# Patient Record
Sex: Female | Born: 2000
Health system: Southern US, Community
[De-identification: ages and names within clinical notes are randomized; demographics above are authoritative.]

## PROBLEM LIST (undated history)

## (undated) DIAGNOSIS — D649 Anemia, unspecified: Secondary | ICD-10-CM

## (undated) DIAGNOSIS — E282 Polycystic ovarian syndrome: Secondary | ICD-10-CM

## (undated) DIAGNOSIS — J45909 Unspecified asthma, uncomplicated: Secondary | ICD-10-CM

## (undated) HISTORY — DX: Anemia, unspecified: D64.9

## (undated) HISTORY — DX: Unspecified asthma, uncomplicated: J45.909

---

## 2020-05-30 ENCOUNTER — Emergency Department (HOSPITAL_COMMUNITY)
Admission: EM | Admit: 2020-05-30 | Discharge: 2020-05-31 | Disposition: A | Discharge status: Home / Self Care | Attending: Emergency Medicine | Admitting: Emergency Medicine

## 2020-05-30 DIAGNOSIS — R55 Syncope and collapse: Secondary | ICD-10-CM | POA: Insufficient documentation

## 2020-05-30 DIAGNOSIS — R569 Unspecified convulsions: Secondary | ICD-10-CM | POA: Diagnosis present

## 2020-05-30 DIAGNOSIS — G40909 Epilepsy, unspecified, not intractable, without status epilepticus: Secondary | ICD-10-CM | POA: Diagnosis not present

## 2020-05-31 ENCOUNTER — Encounter (HOSPITAL_COMMUNITY): Payer: Self-pay

## 2020-05-31 ENCOUNTER — Emergency Department (HOSPITAL_COMMUNITY)

## 2020-05-31 ENCOUNTER — Other Ambulatory Visit: Payer: Self-pay

## 2020-05-31 LAB — CBC
HCT: 38.9 % (ref 36.0–46.0)
Hemoglobin: 12 g/dL (ref 12.0–15.0)
MCH: 27.5 pg (ref 26.0–34.0)
MCHC: 30.8 g/dL (ref 30.0–36.0)
MCV: 89 fL (ref 80.0–100.0)
Platelets: 421 10*3/uL — ABNORMAL HIGH (ref 150–400)
RBC: 4.37 MIL/uL (ref 3.87–5.11)
RDW: 13.9 % (ref 11.5–15.5)
WBC: 11 10*3/uL — ABNORMAL HIGH (ref 4.0–10.5)
nRBC: 0 % (ref 0.0–0.2)

## 2020-05-31 LAB — BASIC METABOLIC PANEL
Anion gap: 8 (ref 5–15)
BUN: 7 mg/dL (ref 6–20)
CO2: 25 mmol/L (ref 22–32)
Calcium: 9.2 mg/dL (ref 8.9–10.3)
Chloride: 103 mmol/L (ref 98–111)
Creatinine, Ser: 0.53 mg/dL (ref 0.44–1.00)
GFR, Estimated: >60 mL/min (ref >60)
Glucose, Bld: 104 mg/dL — ABNORMAL HIGH (ref 70–99)
Potassium: 3.7 mmol/L (ref 3.5–5.1)
Sodium: 136 mmol/L (ref 135–145)

## 2020-05-31 LAB — I-STAT BETA HCG BLOOD, ED (MC, WL, AP ONLY): I-stat hCG, quantitative: <5 m[IU]/mL (ref <5)

## 2020-05-31 NOTE — Discharge Instructions (Addendum)
You were evaluated in the Emergency Department and after careful evaluation, we did not find any emergent condition requiring admission or further testing in the hospital.  Your exam/testing today was overall reassuring.  Labs and CT of your brain were normal.  As discussed, we recommend following up with neurology given the report or possibility of seizure activity.  Recommend that you do not drive a car until you are cleared to do so by neurology.  Please return to the Emergency Department if you experience any worsening of your condition.  Thank you for allowing [place] to be a part of your care.

## 2020-05-31 NOTE — ED Provider Notes (Signed)
[facility]-EMERGENCY DEPT [facility] Emergency Department Provider Note MRN:  [id number]  Arrival date & time: 05/31/20     Chief Complaint   Seizure like activity History of Present Illness   Ashlee Kidd is a 20 y.o. year-old female with no pertinent past medical history presenting to the ED with chief complaint of seizure like activity.  Patient was donating plasma today, she had to leave and so stood up to have the nurse help her remove the IV and then she felt generally unwell, lightheaded and then lost consciousness.  Reportedly she was helped to the ground and exhibited seizure activity.  She denies any urinary incontinence, no tongue biting, she does not recall events while she was exhibiting the seizure like activity.  She woke up and felt generally back to her normal self within a few minutes.  Last time she donated plasma she passed out.  Review of Systems  A complete 10 system review of systems was obtained and all systems are negative except as noted in the HPI and PMH.   Patient's Health History   History reviewed. No pertinent past medical history.    History reviewed. No pertinent family history.  Social History   Socioeconomic History  . Marital status: Single    Spouse name: Not on file  . Number of children: Not on file  . Years of education: Not on file  . Highest education level: Not on file  Occupational History  . Not on file  Tobacco Use  . Smoking status: Not on file  . Smokeless tobacco: Not on file  Substance and Sexual Activity  . Alcohol use: Not on file  . Drug use: Not on file  . Sexual activity: Not on file  Other Topics Concern  . Not on file  Social History Narrative  . Not on file   Social Determinants of Health   Financial Resource Strain: Not on file  Food Insecurity: Not on file  Transportation Needs: Not on file  Physical Activity: Not on file  Stress: Not on file  Social Connections: Not on file  Intimate Partner Violence:  Not on file     Physical Exam   Vitals:   05/30/20 2358  BP: 128/80  Pulse: 94  Resp: 16  Temp: 98.1 F (36.7 C)  SpO2: 100%    CONSTITUTIONAL: Well-appearing, NAD NEURO:  Alert and oriented x 3, normal and symmetric strength and sensation, normal coordination, normal speech EYES:  eyes equal and reactive ENT/NECK:  no LAD, no JVD CARDIO: Regular rate, well-perfused, normal S1 and S2 PULM:  CTAB no wheezing or rhonchi GI/GU:  normal bowel sounds, non-distended, non-tender MSK/SPINE:  No gross deformities, no edema SKIN:  no rash, atraumatic PSYCH:  Appropriate speech and behavior  *Additional and/or pertinent findings included in MDM below  Diagnostic and Interventional Summary    EKG Interpretation  Date/Time:  Thursday May 31 2020 01:29:26 EDT Ventricular Rate:  78 PR Interval:  160 QRS Duration: 84 QT Interval:  376 QTC Calculation: 429 R Axis:   61 Text Interpretation: Sinus rhythm 12 Lead; Mason-Likar No previous ECGs available Confirmed by Kennis Carina [phone]) on 05/31/2020 2:33:49 AM      Labs Reviewed  CBC - Abnormal; Notable for the following components:      Result Value   WBC 11.0 (*)    Platelets 421 (*)    All other components within normal limits  BASIC METABOLIC PANEL - Abnormal; Notable for the following components:   Glucose,  Bld 104 (*)    All other components within normal limits  I-STAT BETA HCG BLOOD, ED ([facility], [facility], [facility] ONLY)    CT HEAD WO CONTRAST  Final Result      Medications - No data to display   Procedures  /  Critical Care Procedures  ED Course and Medical Decision Making  I have reviewed the triage vital signs, the nursing notes, and pertinent available records from the EMR.  Listed above are laboratory and imaging tests that I personally ordered, reviewed, and interpreted and then considered in my medical decision making (see below for details).  Overall I am favoring a vasovagal episode rather than a true epileptic seizure.   Can exhibit some twitching during a syncopal event, however the episode was witnessed by a nurse who felt it was consistent with a seizure.     Work-up is reassuring, appropriate for discharge.  Elmer Sow. Pilar Plate, MD [facility] Emergency Medicine [facility] [email or web address]  Final Clinical Impressions(s) / ED Diagnoses     ICD-10-CM   1. Syncope, convulsive  R55     ED Discharge Orders         Ordered    Ambulatory referral to Neurology       Comments: An appointment is requested in approximately: 2 weeks   05/31/20 0235           Discharge Instructions Discussed with and Provided to Patient:     Discharge Instructions     You were evaluated in the Emergency Department and after careful evaluation, we did not find any emergent condition requiring admission or further testing in the hospital.  Your exam/testing today was overall reassuring.  Labs and CT of your brain were normal.  As discussed, we recommend following up with neurology given the report or possibility of seizure activity.  Recommend that you do not drive a car until you are cleared to do so by neurology.  Please return to the Emergency Department if you experience any worsening of your condition.  Thank you for allowing [place] to be a part of your care.        Sabas Sous, MD 05/31/20 [phone]

## 2020-05-31 NOTE — ED Triage Notes (Signed)
Arrived POV. Donated plasma today at 1330. After donation pt had a witnessed seizure at the facility, unknown how long the seizure lasted. No hx of seizures. Pt wants to be examined to determine if she will have another seizure. Also wants her right arm looked at where needle was for plasma donation due to pain

## 2022-03-10 IMAGING — CT CT HEAD W/O CM
3 series · 15 of 44 positions shown, 18 images · non-contrast
Comparison: None.

CLINICAL DATA: Pt donated plasma 05/30/20 @ 6440 hrs, then had a
witness seizure of unknown duration, no hx of seizures, no prev ct
of head

EXAM:
CT HEAD WITHOUT CONTRAST
TECHNIQUE: Contiguous axial images were obtained from the base of the skull
through the vertex without intravenous contrast.

[Series 2: head wo · axial · 0.47mm/px · z∈[-142,-32]mm · 9 of 27 slices shown, 12 images]
[im 3/27  brain]
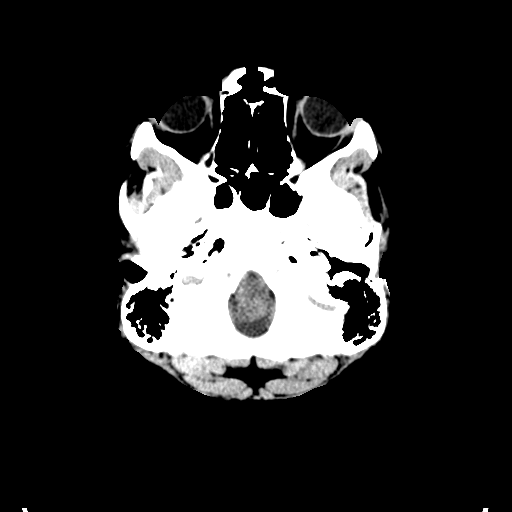
[im 3/27  bone]
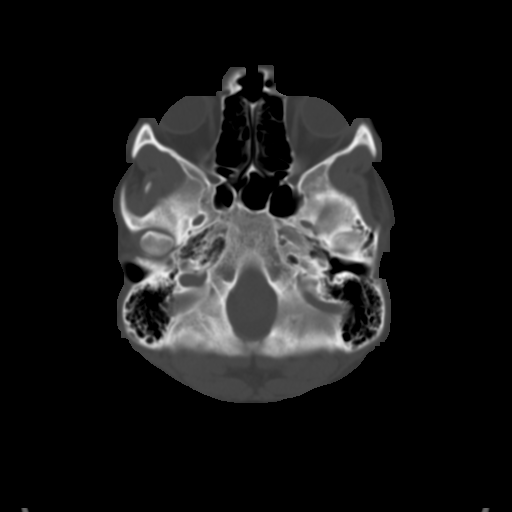
[im 6/27  brain]
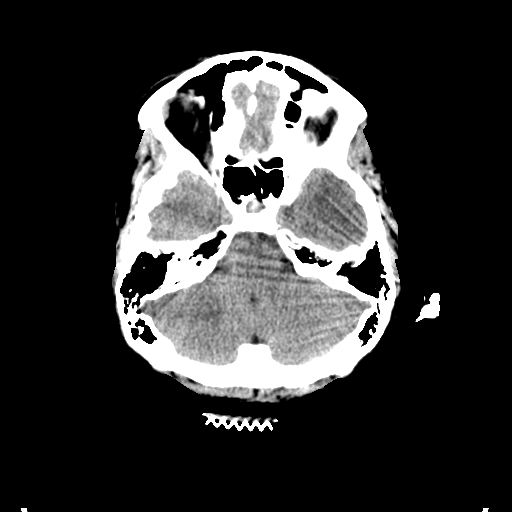
[im 8/27  brain]
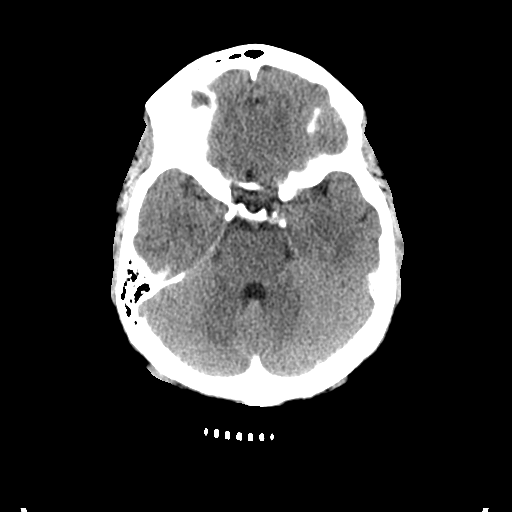
[im 11/27  brain]
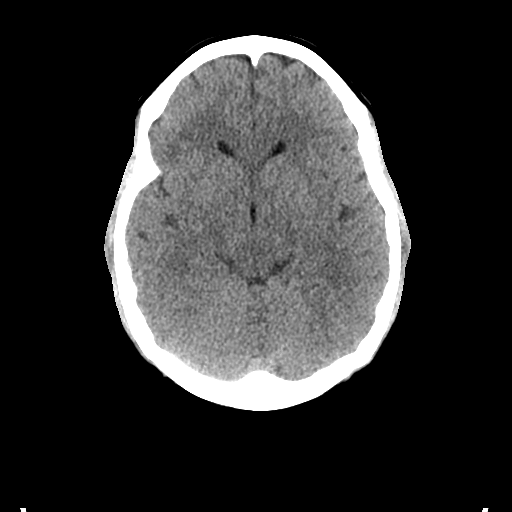
[im 14/27  brain]
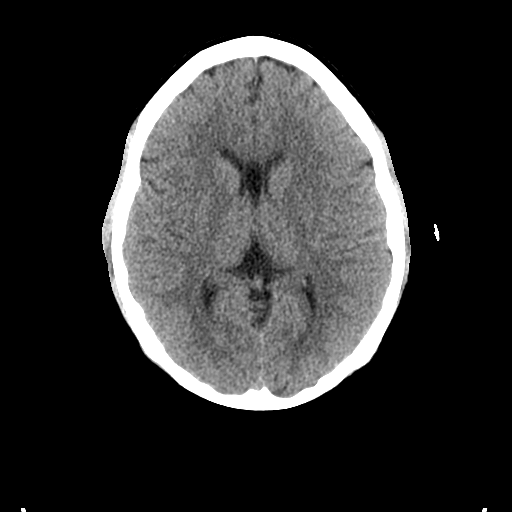
[im 14/27  bone]
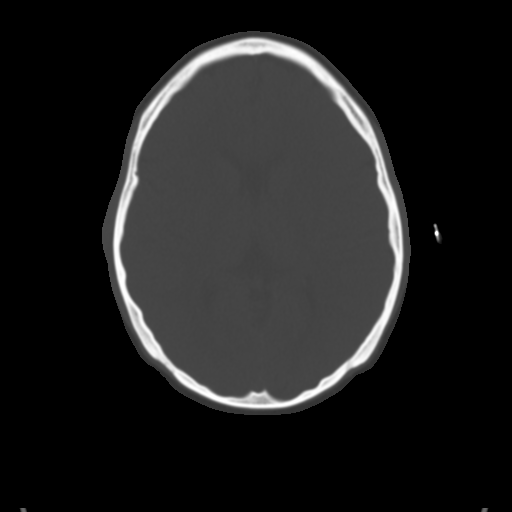
[im 17/27  brain]
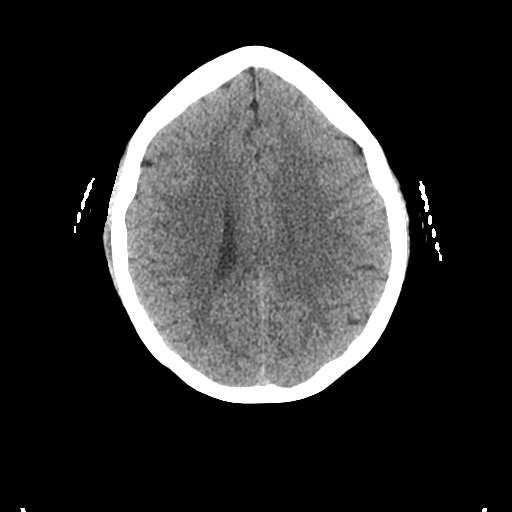
[im 20/27  brain]
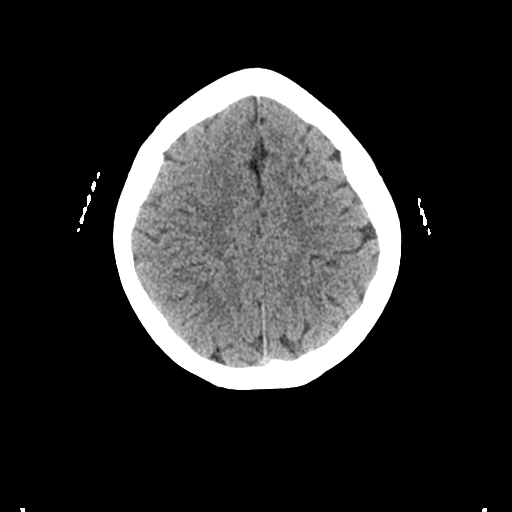
[im 22/27  brain]
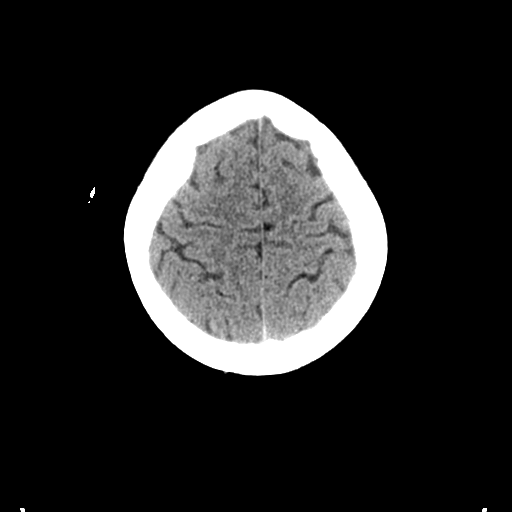
[im 25/27  brain]
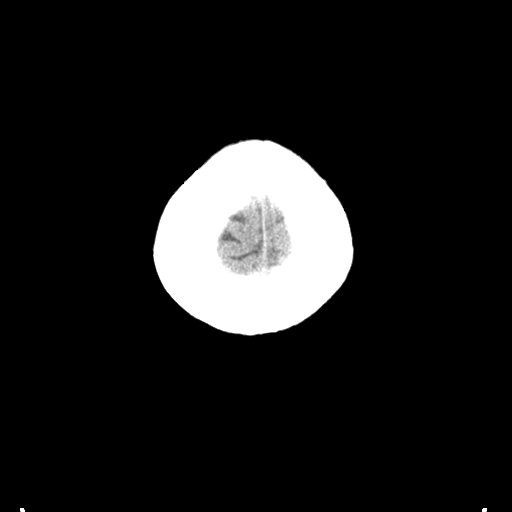
[im 25/27  bone]
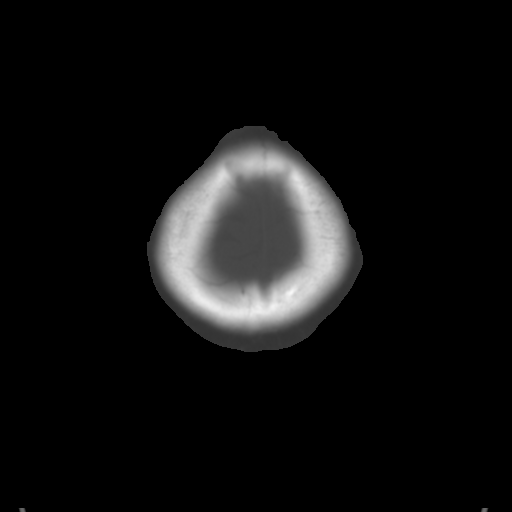

[Series 4: coronal soft tissue · coronal · 0.26mm/px · 3 of 64 slices shown]
[im 22/64  brain]
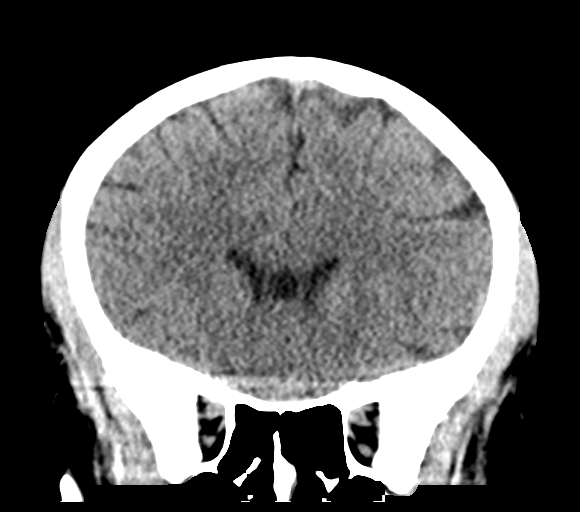
[im 29/64  brain]
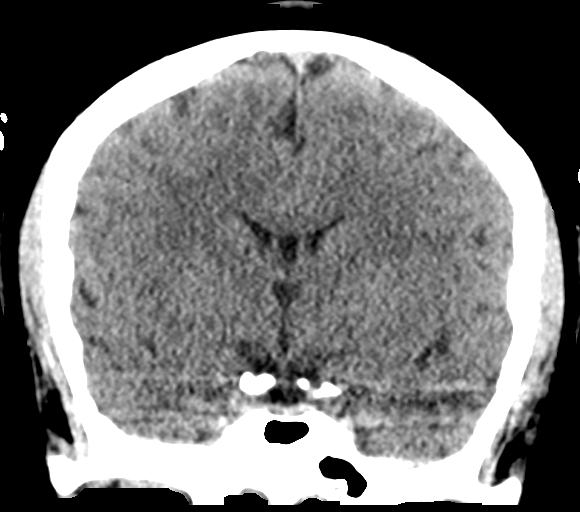
[im 36/64  brain]
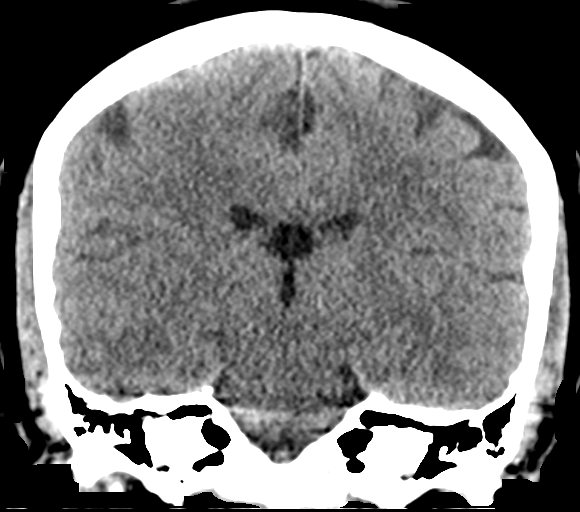

[Series 5: sagittal soft tissue · sagittal · 0.26mm/px · 3 of 52 slices shown]
[im 18/52  brain]
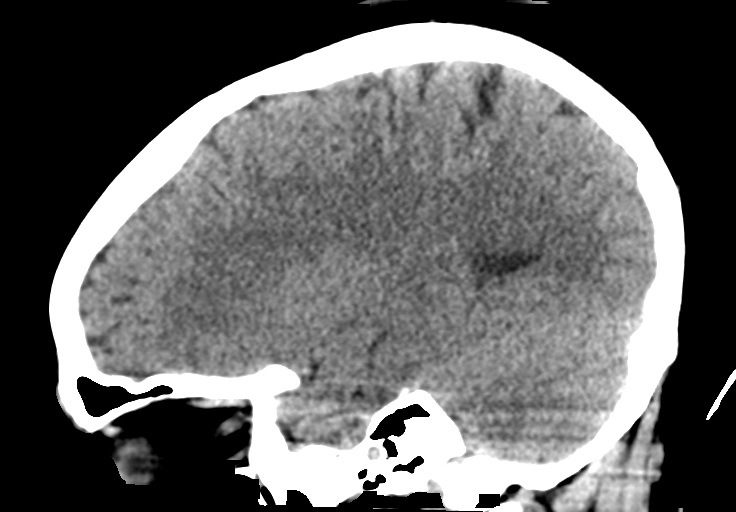
[im 26/52  brain]
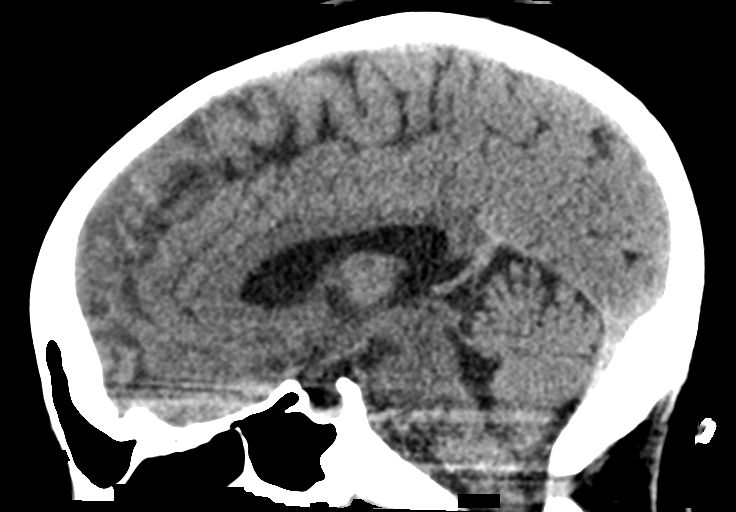
[im 35/52  brain]
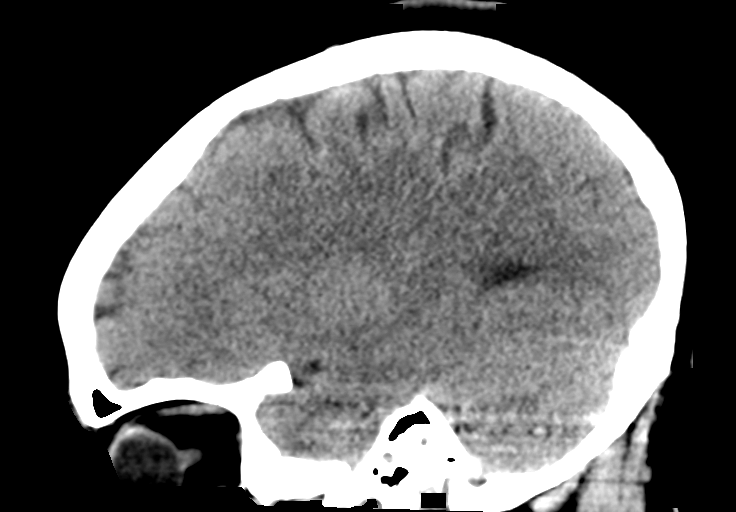

[15 of 44 positions shown; findings below may reference images not displayed]

FINDINGS: Brain:

No evidence of large-territorial acute infarction. No parenchymal
hemorrhage. No mass lesion. No extra-axial collection.

No mass effect or midline shift. No hydrocephalus. Basilar cisterns
are patent. Cavum septum pellucidum variant.

Vascular: No hyperdense vessel.

Skull: No acute fracture or focal lesion.

Sinuses/Orbits: Paranasal sinuses and mastoid air cells are clear.
The orbits are unremarkable.

Other: None.
IMPRESSION: No acute intracranial abnormality.

## 2022-10-27 ENCOUNTER — Encounter (HOSPITAL_COMMUNITY): Payer: Self-pay

## 2022-10-27 ENCOUNTER — Encounter: Payer: Self-pay | Admitting: Family Medicine

## 2022-10-27 ENCOUNTER — Ambulatory Visit (INDEPENDENT_AMBULATORY_CARE_PROVIDER_SITE_OTHER): Admitting: Family Medicine

## 2022-10-27 VITALS — BP 112/73 | HR 79 | Temp 98.5°F | Resp 18 | Ht 65.0 in | Wt 179.0 lb

## 2022-10-27 DIAGNOSIS — Z8379 Family history of other diseases of the digestive system: Secondary | ICD-10-CM

## 2022-10-27 DIAGNOSIS — Z7689 Persons encountering health services in other specified circumstances: Secondary | ICD-10-CM

## 2022-10-27 DIAGNOSIS — M7918 Myalgia, other site: Secondary | ICD-10-CM

## 2022-10-27 DIAGNOSIS — K921 Melena: Secondary | ICD-10-CM

## 2022-10-27 MED ORDER — BACLOFEN 5 MG PO TABS: 5.0000 mg | ORAL_TABLET | Freq: Three times a day (TID) | ORAL | 1 refills | Status: AC | PRN
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
## 2022-10-29 ENCOUNTER — Ambulatory Visit: Admitting: Family Medicine

## 2022-11-15 ENCOUNTER — Encounter (HOSPITAL_BASED_OUTPATIENT_CLINIC_OR_DEPARTMENT_OTHER): Payer: Self-pay

## 2022-11-15 ENCOUNTER — Ambulatory Visit (HOSPITAL_BASED_OUTPATIENT_CLINIC_OR_DEPARTMENT_OTHER): Attending: Family Medicine | Admitting: Physical Therapy

## 2022-11-15 ENCOUNTER — Encounter (HOSPITAL_BASED_OUTPATIENT_CLINIC_OR_DEPARTMENT_OTHER): Payer: Self-pay | Admitting: Physical Therapy
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
## 2022-11-24 ENCOUNTER — Ambulatory Visit: Admitting: Family Medicine

## 2023-03-09 ENCOUNTER — Ambulatory Visit: Payer: Self-pay | Admitting: Family Medicine

## 2023-03-09 NOTE — Telephone Encounter (Signed)
  Chief Complaint: migraine Symptoms: headache, muscle spasms, eye pain, stiff neck and back pain occasionally.  Frequency: 3 weeks Pertinent Negatives: Patient denies CP, SOB Disposition: [] ED /[] Urgent Care (no appt availability in office) / [x] Appointment(In office/virtual)/ []  [facility]/ [] Home Care/ [] Refused Recommended Disposition /[] [facility]/ []  Follow-up with PCP Additional Notes: Patient calls reporting migraine headache intermittently x3 weeks after receiving a series of vaccines for work. States she has been taking ibuprofen otc with mild relief. Per protocol, patient to be evaluated within 24 hours. First available appointment with PCP 03/10/23 at 1110, patient scheduled. Care advice reviewed, patient verbalized understanding and denies further questions at this time. Alerting PCP for review.    Copied from CRM [id number]. Topic: Clinical - Red Word Triage >> Mar 09, 2023  3:45 PM Everette Rank wrote: Red Word that prompted transfer to Nurse Triage: Patient migraine headaches/Have had sezures in past /Neck/Back pain/Black out spells for 3 weeks Reason for Disposition  [1] MODERATE headache (e.g., interferes with normal activities) AND [2] present > 24 hours AND [3] unexplained  (Exceptions: analgesics not tried, typical migraine, or headache part of viral illness)  Answer Assessment - Initial Assessment Questions 1. LOCATION: "Where does it hurt?"      All over headache, travels at times, mostly on the R side 2. ONSET: "When did the headache start?" (Minutes, hours or days)      3 weeks ago after receiving several vaccines 3. PATTERN: "Does the pain come and go, or has it been constant since it started?"     Comes and goes, but lasts 15 min to several hours 4. SEVERITY: "How bad is the pain?" and "What does it keep you from doing?"  (e.g., Scale 1-10; mild, moderate, or severe)   - MILD (1-3): doesn't interfere with normal activities    - MODERATE (4-7):  interferes with normal activities or awakens from sleep    - SEVERE (8-10): excruciating pain, unable to do any normal activities        8/10 when occurring, reports mild at this time 5. RECURRENT SYMPTOM: "Have you ever had headaches before?" If Yes, ask: "When was the last time?" and "What happened that time?"      Hx of migraines, last episode several months ago 6. CAUSE: "What do you think is causing the headache?"     Maybe vaccine reaction 7. MIGRAINE: "Have you been diagnosed with migraine headaches?" If Yes, ask: "Is this headache similar?"      Yes 8. HEAD INJURY: "Has there been any recent injury to the head?"      Denies 9. OTHER SYMPTOMS: "Do you have any other symptoms?" (fever, stiff neck, eye pain, sore throat, cold symptoms)     Muscle spasms in R shoulder, eye pain, stiff neck, back pain 10. PREGNANCY: "Is there any chance you are pregnant?" "When was your last menstrual period?"       Denies, LMP: 02/19/23  Protocols used: [facility]

## 2023-03-10 ENCOUNTER — Ambulatory Visit: Admitting: Family Medicine

## 2023-03-11 ENCOUNTER — Ambulatory Visit: Admitting: Family Medicine

## 2023-03-12 ENCOUNTER — Ambulatory Visit (INDEPENDENT_AMBULATORY_CARE_PROVIDER_SITE_OTHER): Admitting: Family Medicine

## 2023-03-12 ENCOUNTER — Encounter: Payer: Self-pay | Admitting: Family Medicine

## 2023-03-12 VITALS — BP 98/58 | HR 77 | Temp 97.9°F | Resp 18 | Ht 65.0 in | Wt 172.3 lb

## 2023-03-12 DIAGNOSIS — K529 Noninfective gastroenteritis and colitis, unspecified: Secondary | ICD-10-CM | POA: Diagnosis not present

## 2023-03-12 DIAGNOSIS — G43909 Migraine, unspecified, not intractable, without status migrainosus: Secondary | ICD-10-CM | POA: Diagnosis not present

## 2023-03-12 MED ORDER — ONDANSETRON 4 MG PO TBDP: 4.0000 mg | ORAL_TABLET | Freq: Three times a day (TID) | ORAL | 0 refills | Status: AC | PRN

## 2023-03-12 NOTE — Progress Notes (Signed)
 Acute Office Visit  Subjective:     Patient ID: Ashlee Kidd, female    DOB: 2000/12/30, 23 y.o.   MRN: [id number]  Chief Complaint  Patient presents with   Migraine    Patient states that she has been dealing with migraines for the past 3 weeks, she states that she believes its coming from an adverse reaction to vaccines that she was given for work. She states that 2 days ago she started vomiting , had stomach pain, neck pain, and muscle spasms.     HPI Patient is in today for acute visit.  Pt reports she started a new job and was required to get vaccines. She reports she is unable to tell me what vaccines she received. She reports she received 4 different vaccines that same week. She says the end of her 4th vaccines, she reports she had a rash on both of her hands and arms. The rash lasted for 2 days. She then had hives on her arms along with burning sensation. She went to [employer] through her job to get these vaccines. She tried contacting them but unable to reach them. She then went to [facility] who told her as long as she wasn't having an anaphylactic reaction, she would be ok. She does report hx of migraines in the past but none recently. She then a few days later, she experienced migraines that started on Feb 11th. She says she's had one almost every day. She may take otc medicine that may lessen the migraines. She reports she has had muscle spasms, feeling that she will black out, sharp pains in her chest and abdomen. She also reported vomiting episode on Tuesday evening.    Review of Systems  Gastrointestinal:  Positive for abdominal pain and vomiting.  Neurological:  Positive for headaches.  All other systems reviewed and are negative.      Objective:    BP (!) 98/58   Pulse 77   Temp 97.9 F (36.6 C) (Oral)   Resp 18   Ht 5\' 5"  (1.651 m)   Wt 172 lb 4.8 oz (78.2 kg)   SpO2 99%   BMI 28.67 kg/m  BP Readings from Last 3 Encounters:  03/12/23 (!)  98/58  10/27/22 112/73  05/31/20 122/78      Physical Exam Vitals and nursing note reviewed.  Constitutional:      Appearance: Normal appearance. She is normal weight.  HENT:     Head: Normocephalic and atraumatic.     Right Ear: External ear normal.     Left Ear: External ear normal.     Nose: Nose normal.     Mouth/Throat:     Mouth: Mucous membranes are moist.     Pharynx: Oropharynx is clear.  Eyes:     Conjunctiva/sclera: Conjunctivae normal.     Pupils: Pupils are equal, round, and reactive to light.  Cardiovascular:     Rate and Rhythm: Normal rate and regular rhythm.     Pulses: Normal pulses.     Heart sounds: Normal heart sounds.  Pulmonary:     Effort: Pulmonary effort is normal.     Breath sounds: Normal breath sounds.  Abdominal:     General: Abdomen is flat. Bowel sounds are normal.  Skin:    General: Skin is warm.     Capillary Refill: Capillary refill takes less than 2 seconds.  Neurological:     General: No focal deficit present.     Mental Status: She  is alert and oriented to person, place, and time. Mental status is at baseline.  Psychiatric:        Mood and Affect: Mood normal.        Behavior: Behavior normal.        Thought Content: Thought content normal.        Judgment: Judgment normal.   No results found for any visits on 03/12/23.      Assessment & Plan:   Problem List Items Addressed This Visit   None Migraine without status migrainosus, not intractable, unspecified migraine type  Gastroenteritis -     Ondansetron; Take 1 tablet (4 mg total) by mouth every 8 (eight) hours as needed for nausea or vomiting.  Dispense: 20 tablet; Refill: 0   Pt with hx of  migraines that could've been triggered by vaccines? Pt was advised to follow up with the wellness center that provided her with the vaccines. Also suspect acute gastroenteritis. Treat with fluids and zofran given to use TID prn.   No orders of the defined types were placed in this  encounter.   No follow-ups on file.  Suzan Slick, MD

## 2023-03-21 ENCOUNTER — Encounter: Payer: Self-pay | Admitting: Family Medicine

## 2023-05-11 ENCOUNTER — Ambulatory Visit: Payer: Self-pay

## 2023-05-11 NOTE — Telephone Encounter (Signed)
 Chief Complaint: abdominal pain Symptoms: severe pain, back pain, heavy menstrual flow, chronic CP, lightheadedness Frequency: x 1 week Pertinent Negatives: Patient denies fever, injuries, SOB Disposition: [x] ED /[] Urgent Care (no appt availability in office) / [] Appointment(In office/virtual)/ []  [facility]/ [] Home Care/ [] Refused Recommended Disposition /[] [facility]/ []  Follow-up with PCP Additional Notes: Pt c/o severe abdominal pain x 1 week. Pt reports also starting menstrual cycle today with heavier flow than usual, some back pain, lightheadedness, and chronic CP. Pt has taken tylenol with minimum/no relief. Pt unsure of severe abd pain is r/t heavy flow. Triager advised ED for further evaluation/tx. Patient verbalized understanding and to call 911 with worsening symptoms.     Copied from CRM [id number]. Topic: Clinical - Red Word Triage >> May 11, 2023 11:44 AM Carlatta H wrote: [facility] that prompted transfer to Nurse Triage: Patient has been having severe stomach pains and now heavy bleeding//symptoms for about 1 week// Reason for Disposition  [1] SEVERE pain (e.g., excruciating) AND [2] present > 1 hour  Answer Assessment - Initial Assessment Questions 1. LOCATION: "Where does it hurt?"      Behind belly button - "felt like it was ripping out my insides" Period started today - flow is a lot heavier than normal 2. ONSET: "When did this episode of pain begin?"       1.5 weeks ago 3. SEVERITY: "How bad is the pain?" "Are you missing school or work because of the pain?"  (e.g., Scale 1-10; mild, moderate, or severe)   - MILD (1-3): Doesn't interfere with normal activities, lasting 1 to 2 days.    - MODERATE (4-7): Interferes with normal activities (missing work or school), lasting 2 to 3 days, some associated GI symptoms.    - SEVERE (8-10): Excruciating pain, lasting 2-7 days, associated GI symptoms, pain radiating into thighs and back.     9.5/10 - tylenol  with no relief 4. VAGINAL BLEEDING: "Describe the bleeding that you are having." "How much bleeding is there?"    - SPOTTING: spotting, or pinkish / brownish mucous discharge; does not fill panty liner or pad    - MILD:  less than 1 pad / hour; less than patient's usual menstrual bleeding   - MODERATE: 1-2 pads / hour; 1 menstrual cup every 6 hours; small-medium blood clots (e.g., pea, grape, small coin)   - SEVERE: soaking 2 or more pads/hour for 2 or more hours; 1 menstrual cup every 2 hours; bleeding not contained by pads or continuous red blood from vagina; large blood clots (e.g., golf ball, large coin)      Moderate - clots size of grape 5. MENSTRUAL HISTORY:  "When did this menstrual period begin?", "Is this a normal period for you?"       today 6. LMP:  "When did your last menstrual period begin?"     LMP ended 4/12 7. OTHER SYMPTOMS: "Do you have any other symptoms?" (e.g., back pain, diarrhea, dizzy or lightheaded, fever, urination pain, vaginal discharge, vomiting)     Back pain, constipation, lightheadedness, abnormal urination feeling-- "not pain"  Answer Assessment - Initial Assessment Questions 2. RADIATION: "Does the pain shoot anywhere else?" (e.g., chest, back)     "Shooting pain in my chest but it has been going on for years" L side chest right under breast - pt reports has been evaluated with no dx.  4. SUDDEN: "Gradual or sudden onset?"     Sudden onset 5. PATTERN "Does the pain  come and go, or is it constant?"    - If it comes and goes: "How long does it last?" "Do you have pain now?"     (Note: Comes and goes means the pain is intermittent. It goes away completely between bouts.)    - If constant: "Is it getting better, staying the same, or getting worse?"      (Note: Constant means the pain never goes away completely; most serious pain is constant and gets worse.)      constant  7. RECURRENT SYMPTOM: "Have you ever had this type of stomach pain before?" If Yes, ask:  "When was the last time?" and "What happened that time?"      Yes, during period cycles, but not this bad. Unknown if abd pain is r/t cycle Report last week had to sit down, hearing and vision "went out and I had to sit down bc I felt like I was gonna faint" - has been happening for years, usually self-resolves with rest/sitting x 1.5 hrs 8. CAUSE: "What do you think is causing the stomach pain?"     unknown  Protocols used: Abdominal Pain - Menstrual Cramps-A-AH, Abdominal Pain - Female-A-AH

## 2023-08-23 ENCOUNTER — Encounter: Payer: Self-pay | Admitting: Emergency Medicine

## 2023-08-23 ENCOUNTER — Ambulatory Visit
Admission: EM | Admit: 2023-08-23 | Discharge: 2023-08-23 | Disposition: A | Discharge status: Home / Self Care | Attending: Emergency Medicine | Admitting: Emergency Medicine

## 2023-08-23 DIAGNOSIS — N898 Other specified noninflammatory disorders of vagina: Secondary | ICD-10-CM | POA: Diagnosis present

## 2023-08-23 HISTORY — DX: Polycystic ovarian syndrome: E28.2

## 2023-08-23 LAB — POCT URINE PREGNANCY: Preg Test, Ur: NEGATIVE

## 2023-08-23 MED ORDER — FLUCONAZOLE 150 MG PO TABS: 150.0000 mg | ORAL_TABLET | ORAL | 0 refills | Status: DC | PRN

## 2023-08-23 NOTE — Discharge Instructions (Addendum)
 Today you are being treated  for yeast.   Take diflucan  150 mg once, if symptoms still present in 3 days then you may take second pill   Yeast infections which are caused by a naturally occurring fungus called candida. Vaginosis is an inflammation of the vagina that can result in discharge, itching and pain. The cause is usually a change in the normal balance of vaginal bacteria or an infection. Vaginosis can also result from reduced estrogen levels after menopause.  Labs pending 2-3 days, you will be contacted if positive for any sti and treatment will be sent to the pharmacy, you will have to return to the clinic if positive for gonorrhea to receive treatment   Please refrain from having sex until labs results, if positive please refrain from having sex until treatment complete and symptoms resolve   If positive for  Chlamydia  gonorrhea or trichomoniasis please notify partner or partners so they may tested as well  Moving forward, it is recommended you use some form of protection against the transmission of sti infections  such as condoms or dental dams with each sexual encounter    In addition:   Avoid baths, hot tubs and whirlpool spas.  Don't use scented or harsh soaps, such as those with deodorant or antibacterial action. Avoid irritants. These include scented tampons and pads. Wipe from front to back after using the toilet.  Don't douche. Your vagina doesn't require cleansing other than normal bathing.  Use a  condom. Wear cotton underwear, this fabric helps absorb moisture

## 2023-08-23 NOTE — ED Provider Notes (Signed)
 Ashlee Kidd    CSN: [id number] Arrival date & time: 08/23/23  1413      History   Chief Complaint Chief Complaint  Patient presents with   Vaginitis    HPI Ashlee Kidd is a 23 y.o. female.   Patient is in for evaluation of Erhardt Dada thick vaginal discharge and itching present for 3 days.  Attempted use of Azo yeast medication and probiotics which has been ineffective.  Denies abdominal pain, flank pain, fever, urinary symptoms or vaginal odor.  Past Medical History:  Diagnosis Date   Anemia    Asthma    PCOS (polycystic ovarian syndrome)     There are no active problems to display for this patient.   History reviewed. No pertinent surgical history.  OB History   No obstetric history on file.      Home Medications    Prior to Admission medications   Medication Sig Start Date End Date Taking? Authorizing Provider  fluconazole  (DIFLUCAN ) 150 MG tablet Take 1 tablet (150 mg total) by mouth every three (3) days as needed for up to 2 doses. 08/23/23  Yes Alaycia Eardley R, NP  Baclofen  5 MG TABS Take 1 tablet (5 mg total) by mouth 3 (three) times daily as needed. Patient not taking: Reported on 03/12/2023 10/27/22   Colette Torrence GRADE, MD  ondansetron  (ZOFRAN -ODT) 4 MG disintegrating tablet Take 1 tablet (4 mg total) by mouth every 8 (eight) hours as needed for nausea or vomiting. 03/12/23   Colette Torrence GRADE, MD    Family History Family History  Problem Relation Age of Onset   Thyroid disease Father     Social History Social History   Tobacco Use   Smoking status: Never    Passive exposure: Never   Smokeless tobacco: Never  Vaping Use   Vaping status: Never Used  Substance Use Topics   Alcohol use: Yes    Comment: occ   Drug use: Never     Allergies   Patient has no known allergies.   Review of Systems Review of Systems   Physical Exam Triage Vital Signs ED Triage Vitals  Encounter Vitals Group     BP 08/23/23 1421 116/77     Girls  Systolic BP Percentile --      Girls Diastolic BP Percentile --      Boys Systolic BP Percentile --      Boys Diastolic BP Percentile --      Pulse Rate 08/23/23 1421 81     Resp 08/23/23 1421 18     Temp 08/23/23 1421 98 F (36.7 C)     Temp Source 08/23/23 1421 Oral     SpO2 08/23/23 1421 98 %     Weight --      Height --      Head Circumference --      Peak Flow --      Pain Score 08/23/23 1424 0     Pain Loc --      Pain Education --      Exclude from Growth Chart --    No data found.  Updated Vital Signs BP 116/77 (BP Location: Left Arm)   Pulse 81   Temp 98 F (36.7 C) (Oral)   Resp 18   LMP 07/21/2023 (Exact Date)   SpO2 98%   Visual Acuity Right Eye Distance:   Left Eye Distance:   Bilateral Distance:    Right Eye Near:   Left Eye Near:  Bilateral Near:     Physical Exam Constitutional:      Appearance: Normal appearance.  Eyes:     Extraocular Movements: Extraocular movements intact.  Pulmonary:     Effort: Pulmonary effort is normal.  Abdominal:     Tenderness: There is no abdominal tenderness. There is no right CVA tenderness, left CVA tenderness or guarding.  Genitourinary:    Comments: deferred Neurological:     Mental Status: She is alert and oriented to person, place, and time. Mental status is at baseline.      UC Treatments / Results  Labs (all labs ordered are listed, but only abnormal results are displayed) Labs Reviewed  POCT URINE PREGNANCY  CERVICOVAGINAL ANCILLARY ONLY    EKG   Radiology No results found.  Procedures Procedures (including critical care time)  Medications Ordered in UC Medications - No data to display  Initial Impression / Assessment and Plan / UC Course  I have reviewed the triage vital signs and the nursing notes.  Pertinent labs & imaging results that were available during my care of the patient were reviewed by me and considered in my medical decision making (see chart for  details).   Vaginal discharge  Treating empirically for yeast, Diflucan  prescribed, urine pregnancy test negative, last menstrual period 07/21/2023,STI labs pending will treat per protocol, advised abstinence until lab results, and/or treatment is complete, advised condom use during all sexual encounters moving, may follow-up with urgent care as needed  Final Clinical Impressions(s) / UC Diagnoses   Final diagnoses:  Vaginal discharge     Discharge Instructions      Today you are being treated  for yeast.   Take diflucan  150 mg once, if symptoms still present in 3 days then you may take second pill   Yeast infections which are caused by a naturally occurring fungus called candida. Vaginosis is an inflammation of the vagina that can result in discharge, itching and pain. The cause is usually a change in the normal balance of vaginal bacteria or an infection. Vaginosis can also result from reduced estrogen levels after menopause.  Labs pending 2-3 days, you will be contacted if positive for any sti and treatment will be sent to the pharmacy, you will have to return to the clinic if positive for gonorrhea to receive treatment   Please refrain from having sex until labs results, if positive please refrain from having sex until treatment complete and symptoms resolve   If positive for  Chlamydia  gonorrhea or trichomoniasis please notify partner or partners so they may tested as well  Moving forward, it is recommended you use some form of protection against the transmission of sti infections  such as condoms or dental dams with each sexual encounter    In addition:   Avoid baths, hot tubs and whirlpool spas.  Don't use scented or harsh soaps, such as those with deodorant or antibacterial action. Avoid irritants. These include scented tampons and pads. Wipe from front to back after using the toilet.  Don't douche. Your vagina doesn't require cleansing other than normal bathing.  Use a   condom. Wear cotton underwear, this fabric helps absorb moisture     ED Prescriptions     Medication Sig Dispense Auth. Provider   fluconazole  (DIFLUCAN ) 150 MG tablet Take 1 tablet (150 mg total) by mouth every three (3) days as needed for up to 2 doses. 2 tablet Yassmin Binegar R, NP      PDMP not reviewed this  encounter.   Teresa Shelba SAUNDERS, NP 08/23/23 1446

## 2023-08-23 NOTE — ED Triage Notes (Signed)
 Patient complains of possible yeast infection. Patient complains white creamy vaginal discharge and itching x 3 days. Patient has used probiotic and AZO yeast care with relief. Patient denies pain.

## 2023-08-24 ENCOUNTER — Ambulatory Visit (HOSPITAL_COMMUNITY): Payer: Self-pay

## 2023-08-24 LAB — CERVICOVAGINAL ANCILLARY ONLY
Bacterial Vaginitis (gardnerella): NEGATIVE
Candida Glabrata: NEGATIVE
Candida Vaginitis: POSITIVE — AB
Chlamydia: NEGATIVE
Comment: NEGATIVE
Comment: NEGATIVE
Comment: NEGATIVE
Comment: NEGATIVE
Comment: NEGATIVE
Comment: NORMAL
Neisseria Gonorrhea: NEGATIVE
Trichomonas: NEGATIVE

## 2023-09-09 ENCOUNTER — Ambulatory Visit
Admission: EM | Admit: 2023-09-09 | Discharge: 2023-09-09 | Disposition: A | Discharge status: Home / Self Care | Attending: Emergency Medicine | Admitting: Emergency Medicine

## 2023-09-09 ENCOUNTER — Encounter: Payer: Self-pay | Admitting: Emergency Medicine

## 2023-09-09 DIAGNOSIS — B354 Tinea corporis: Secondary | ICD-10-CM

## 2023-09-09 MED ORDER — FLUCONAZOLE 150 MG PO TABS: 150.0000 mg | ORAL_TABLET | ORAL | 0 refills | Status: AC

## 2023-09-09 MED ORDER — FLUCONAZOLE 150 MG PO TABS: 150.0000 mg | ORAL_TABLET | ORAL | 0 refills | Status: DC

## 2023-09-09 NOTE — ED Triage Notes (Signed)
 Patient reports that she had ringworms on neck and she used ringworm cream with no results. Patient reports red itchy rash all over upper body.

## 2023-09-09 NOTE — Discharge Instructions (Signed)
 Today you are evaluated for the rash your skin which is consistent with a fungus, more information in your packet regarding ringworm  Take Diflucan  every week for the next month to help get rid of fungus  May continue topical medicine applying twice daily over the affected area  For itching you may attempt use of allergy medicine such as Claritin or Zyrtec, and may take Benadryl for severe, may apply topical Benadryl cream or calamine lotion to further soothe the skin  Continue to cleanse at home to prevent reinfection and further spread  If your symptoms continue to persist please follow-up with the dermatologist information is on front page

## 2023-09-09 NOTE — ED Provider Notes (Signed)
 Ashlee Kidd    CSN: [id number] Arrival date & time: 09/09/23  1847      History   Chief Complaint Chief Complaint  Patient presents with   Rash    HPI Ashlee Kidd is a 23 y.o. female.   Patient presents for evaluation of a erythematous and burning rash present to the upper body beginning 2 weeks ago.  Initially began on the back of the neck after getting hair braided, believed to have a dirty cape placed over her body.  Symptoms began the day after.  Over the last 24 hours has begun to spread to the upper body and has noticed lesions to the chest and the back.  Has been using fungal medicine 2 weeks and has seen no improvement.  Denies changes in diet toiletries or recent travel, no other contact has similar symptoms.  Past Medical History:  Diagnosis Date   Anemia    Asthma    PCOS (polycystic ovarian syndrome)     There are no active problems to display for this patient.   History reviewed. No pertinent surgical history.  OB History   No obstetric history on file.      Home Medications    Prior to Admission medications   Medication Sig Start Date End Date Taking? Authorizing Provider  Baclofen  5 MG TABS Take 1 tablet (5 mg total) by mouth 3 (three) times daily as needed. Patient not taking: Reported on 03/12/2023 10/27/22   Colette Torrence GRADE, MD  fluconazole  (DIFLUCAN ) 150 MG tablet Take 1 tablet (150 mg total) by mouth once a week for 4 doses. 09/09/23 10/01/23  Teresa Shelba SAUNDERS, NP  ondansetron  (ZOFRAN -ODT) 4 MG disintegrating tablet Take 1 tablet (4 mg total) by mouth every 8 (eight) hours as needed for nausea or vomiting. 03/12/23   Colette Torrence GRADE, MD    Family History Family History  Problem Relation Age of Onset   Thyroid disease Father     Social History Social History   Tobacco Use   Smoking status: Never    Passive exposure: Never   Smokeless tobacco: Never  Vaping Use   Vaping status: Never Used  Substance Use Topics   Alcohol use:  Yes    Comment: occ   Drug use: Never     Allergies   Patient has no known allergies.   Review of Systems Review of Systems  Skin:  Positive for rash.     Physical Exam Triage Vital Signs ED Triage Vitals  Encounter Vitals Group     BP 09/09/23 1859 112/72     Girls Systolic BP Percentile --      Girls Diastolic BP Percentile --      Boys Systolic BP Percentile --      Boys Diastolic BP Percentile --      Pulse Rate 09/09/23 1859 76     Resp 09/09/23 1859 18     Temp 09/09/23 1859 98.2 F (36.8 C)     Temp Source 09/09/23 1859 Oral     SpO2 09/09/23 1859 98 %     Weight --      Height --      Head Circumference --      Peak Flow --      Pain Score 09/09/23 1856 8     Pain Loc --      Pain Education --      Exclude from Growth Chart --    No data found.  Updated Vital  Signs BP 112/72 (BP Location: Left Arm)   Pulse 76   Temp 98.2 F (36.8 C) (Oral)   Resp 18   LMP 08/21/2023 (Exact Date)   SpO2 98%   Visual Acuity Right Eye Distance:   Left Eye Distance:   Bilateral Distance:    Right Eye Near:   Left Eye Near:    Bilateral Near:     Physical Exam Constitutional:      Appearance: Normal appearance.  Eyes:     Extraocular Movements: Extraocular movements intact.  Neck:     Comments: Ringworm present to the posterior neck towards the left side, small ringworms present to the trunk Pulmonary:     Effort: Pulmonary effort is normal.  Neurological:     Mental Status: She is alert and oriented to person, place, and time. Mental status is at baseline.      UC Treatments / Results  Labs (all labs ordered are listed, but only abnormal results are displayed) Labs Reviewed - No data to display  EKG   Radiology No results found.  Procedures Procedures (including critical care time)  Medications Ordered in UC Medications - No data to display  Initial Impression / Assessment and Plan / UC Course  I have reviewed the triage vital signs and  the nursing notes.  Pertinent labs & imaging results that were available during my care of the patient were reviewed by me and considered in my medical decision making (see chart for details).  Tinea corporis  Presentation consistent with fungal rash, progressively worsening as it spread to the trunk of the body, discussed this with patient, prescribed oral Diflucan  for 1 month to be taken weekly, recommended continued use of topical medicine and cleansing of the home, gave over-the-counter medications for management of pruritus and walking referred to dermatology if symptoms do not improve Final Clinical Impressions(s) / UC Diagnoses   Final diagnoses:  Tinea corporis     Discharge Instructions      Today you are evaluated for the rash your skin which is consistent with a fungus, more information in your packet regarding ringworm  Take Diflucan  every week for the next month to help get rid of fungus  May continue topical medicine applying twice daily over the affected area  For itching you may attempt use of allergy medicine such as Claritin or Zyrtec, and may take Benadryl for severe, may apply topical Benadryl cream or calamine lotion to further soothe the skin  Continue to cleanse at home to prevent reinfection and further spread  If your symptoms continue to persist please follow-up with the dermatologist information is on front page   ED Prescriptions     Medication Sig Dispense Auth. Provider   fluconazole  (DIFLUCAN ) 150 MG tablet  (Status: Discontinued) Take 1 tablet (150 mg total) by mouth once a week for 4 doses. 4 tablet Jaelene Garciagarcia R, NP   fluconazole  (DIFLUCAN ) 150 MG tablet Take 1 tablet (150 mg total) by mouth once a week for 4 doses. 4 tablet Jazzlynn Rawe R, NP      PDMP not reviewed this encounter.   Teresa Shelba SAUNDERS, NP 09/09/23 1919
